# Patient Record
Sex: Male | Born: 1980 | Race: White | Hispanic: No | Marital: Single | Smoking: Current every day smoker
Health system: Southern US, Community
[De-identification: ages and names within clinical notes are randomized; demographics above are authoritative.]

## PROBLEM LIST (undated history)

## (undated) HISTORY — PX: HERNIA REPAIR: SHX51

---

## 2011-01-23 ENCOUNTER — Emergency Department (HOSPITAL_COMMUNITY)
Admission: EM | Admit: 2011-01-23 | Discharge: 2011-01-24 | Attending: Emergency Medicine | Admitting: Emergency Medicine

## 2011-01-23 ENCOUNTER — Encounter: Payer: Self-pay | Admitting: *Deleted

## 2011-01-23 ENCOUNTER — Emergency Department (HOSPITAL_COMMUNITY)

## 2011-01-23 DIAGNOSIS — S0990XA Unspecified injury of head, initial encounter: Secondary | ICD-10-CM | POA: Insufficient documentation

## 2011-01-23 DIAGNOSIS — R42 Dizziness and giddiness: Secondary | ICD-10-CM | POA: Insufficient documentation

## 2011-01-23 DIAGNOSIS — S0181XA Laceration without foreign body of other part of head, initial encounter: Secondary | ICD-10-CM

## 2011-01-23 DIAGNOSIS — S0180XA Unspecified open wound of other part of head, initial encounter: Secondary | ICD-10-CM | POA: Insufficient documentation

## 2011-01-23 DIAGNOSIS — S022XXA Fracture of nasal bones, initial encounter for closed fracture: Secondary | ICD-10-CM | POA: Insufficient documentation

## 2011-01-23 DIAGNOSIS — S02640A Fracture of ramus of mandible, unspecified side, initial encounter for closed fracture: Secondary | ICD-10-CM | POA: Insufficient documentation

## 2011-01-23 DIAGNOSIS — Y9210 Unspecified residential institution as the place of occurrence of the external cause: Secondary | ICD-10-CM | POA: Insufficient documentation

## 2011-01-23 DIAGNOSIS — R112 Nausea with vomiting, unspecified: Secondary | ICD-10-CM | POA: Insufficient documentation

## 2011-01-23 DIAGNOSIS — S02609A Fracture of mandible, unspecified, initial encounter for closed fracture: Secondary | ICD-10-CM

## 2011-01-23 DIAGNOSIS — F17200 Nicotine dependence, unspecified, uncomplicated: Secondary | ICD-10-CM | POA: Insufficient documentation

## 2011-01-23 DIAGNOSIS — R51 Headache: Secondary | ICD-10-CM | POA: Insufficient documentation

## 2011-01-23 MED ORDER — ONDANSETRON 4 MG PO TBDP
ORAL_TABLET | ORAL | Status: AC
  Administered 2011-01-23: 4 mg
  Filled 2011-01-23: qty 1

## 2011-01-23 MED ORDER — LIDOCAINE HCL (PF) 1 % IJ SOLN
INTRAMUSCULAR | Status: AC
  Administered 2011-01-23: 23:00:00
  Filled 2011-01-23: qty 5

## 2011-01-23 MED ORDER — HYDROCODONE-ACETAMINOPHEN 5-325 MG PO TABS
2.0000 | ORAL_TABLET | Freq: Once | ORAL | Status: AC
  Administered 2011-01-23: 2 via ORAL
  Filled 2011-01-23: qty 2

## 2011-01-23 MED ORDER — PROMETHAZINE HCL 25 MG PO TABS: 25.0000 mg | ORAL_TABLET | Freq: Four times a day (QID) | ORAL | Status: AC | PRN

## 2011-01-23 MED ORDER — HYDROCODONE-ACETAMINOPHEN 5-500 MG PO TABS: 1.0000 | ORAL_TABLET | Freq: Four times a day (QID) | ORAL | Status: AC | PRN

## 2011-01-23 NOTE — ED Notes (Signed)
Lac noted below right eye.
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
## 2011-01-23 NOTE — ED Notes (Signed)
Patient transported to CT 

## 2011-01-24 MED ORDER — HYDROMORPHONE HCL PF 1 MG/ML IJ SOLN
INTRAMUSCULAR | Status: AC
  Administered 2011-01-23: 1 mg via INTRAMUSCULAR
  Filled 2011-01-24: qty 1
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
## 2017-10-24 ENCOUNTER — Encounter (HOSPITAL_COMMUNITY): Payer: Self-pay | Admitting: Emergency Medicine

## 2017-10-24 ENCOUNTER — Emergency Department (HOSPITAL_COMMUNITY)
Admission: EM | Admit: 2017-10-24 | Discharge: 2017-10-25 | Disposition: A | Discharge status: Home / Self Care | Payer: Self-pay | Attending: Emergency Medicine | Admitting: Emergency Medicine

## 2017-10-24 ENCOUNTER — Emergency Department (HOSPITAL_COMMUNITY): Payer: Self-pay

## 2017-10-24 ENCOUNTER — Other Ambulatory Visit: Payer: Self-pay

## 2017-10-24 DIAGNOSIS — R109 Unspecified abdominal pain: Secondary | ICD-10-CM | POA: Insufficient documentation

## 2017-10-24 DIAGNOSIS — M7989 Other specified soft tissue disorders: Secondary | ICD-10-CM | POA: Insufficient documentation

## 2017-10-24 DIAGNOSIS — R112 Nausea with vomiting, unspecified: Secondary | ICD-10-CM | POA: Insufficient documentation

## 2017-10-24 DIAGNOSIS — Z5321 Procedure and treatment not carried out due to patient leaving prior to being seen by health care provider: Secondary | ICD-10-CM | POA: Insufficient documentation

## 2017-10-24 DIAGNOSIS — R197 Diarrhea, unspecified: Secondary | ICD-10-CM | POA: Insufficient documentation

## 2017-10-24 LAB — COMPREHENSIVE METABOLIC PANEL
ALK PHOS: 48 U/L (ref 38–126)
ALT: 18 U/L (ref 0–44)
ANION GAP: 5 (ref 5–15)
AST: 22 U/L (ref 15–41)
Albumin: 3.8 g/dL (ref 3.5–5.0)
BILIRUBIN TOTAL: 0.5 mg/dL (ref 0.3–1.2)
BUN: 13 mg/dL (ref 6–20)
CALCIUM: 8.6 mg/dL — AB (ref 8.9–10.3)
CO2: 20 mmol/L — ABNORMAL LOW (ref 22–32)
Chloride: 111 mmol/L (ref 98–111)
Creatinine, Ser: 0.95 mg/dL (ref 0.61–1.24)
Glucose, Bld: 106 mg/dL — ABNORMAL HIGH (ref 70–99)
Potassium: 3.9 mmol/L (ref 3.5–5.1)
SODIUM: 136 mmol/L (ref 135–145)
Total Protein: 6.1 g/dL — ABNORMAL LOW (ref 6.5–8.1)

## 2017-10-24 LAB — URINALYSIS, ROUTINE W REFLEX MICROSCOPIC
Bilirubin Urine: NEGATIVE
Glucose, UA: NEGATIVE mg/dL
HGB URINE DIPSTICK: NEGATIVE
Ketones, ur: NEGATIVE mg/dL
LEUKOCYTES UA: NEGATIVE
Nitrite: NEGATIVE
PROTEIN: NEGATIVE mg/dL
Specific Gravity, Urine: 1.029 (ref 1.005–1.030)
pH: 5 (ref 5.0–8.0)

## 2017-10-24 LAB — CBC WITH DIFFERENTIAL/PLATELET
ABS IMMATURE GRANULOCYTES: 0 10*3/uL (ref 0.0–0.1)
BASOS ABS: 0.1 10*3/uL (ref 0.0–0.1)
BASOS PCT: 1 %
Eosinophils Absolute: 0.4 10*3/uL (ref 0.0–0.7)
Eosinophils Relative: 6 %
HCT: 45 % (ref 39.0–52.0)
Hemoglobin: 14.9 g/dL (ref 13.0–17.0)
Immature Granulocytes: 0 %
Lymphocytes Relative: 31 %
Lymphs Abs: 1.9 10*3/uL (ref 0.7–4.0)
MCH: 31.4 pg (ref 26.0–34.0)
MCHC: 33.1 g/dL (ref 30.0–36.0)
MCV: 94.7 fL (ref 78.0–100.0)
Monocytes Absolute: 0.6 10*3/uL (ref 0.1–1.0)
Monocytes Relative: 11 %
NEUTROS ABS: 3.1 10*3/uL (ref 1.7–7.7)
NEUTROS PCT: 51 %
PLATELETS: 207 10*3/uL (ref 150–400)
RBC: 4.75 MIL/uL (ref 4.22–5.81)
RDW: 12.6 % (ref 11.5–15.5)
WBC: 6.1 10*3/uL (ref 4.0–10.5)

## 2017-10-24 LAB — LIPASE, BLOOD: LIPASE: 26 U/L (ref 11–51)

## 2017-10-24 LAB — I-STAT CG4 LACTIC ACID, ED: Lactic Acid, Venous: 1.06 mmol/L (ref 0.5–1.9)

## 2017-10-24 NOTE — ED Notes (Signed)
No answer x3 for treatment room 

## 2017-10-24 NOTE — ED Triage Notes (Addendum)
Pt states he has had mid abdominal pain since yesterday with nausea vomiting and diarrhea. Describes the pain as a cramping. He also has several spots on his arms of "scabs" that are new too. Pt also states his ankles and wrists are swollen too. Pt states his diarrhea is yellow. Denies any recent travel. Also has body aches, cough and congestion.

## 2019-11-24 IMAGING — CR DG CHEST 2V
2 series · 2 of 2 positions shown · non-contrast
Comparison: None.

CLINICAL DATA: Dyspnea

EXAM:
CHEST - 2 VIEW

[chest pa]
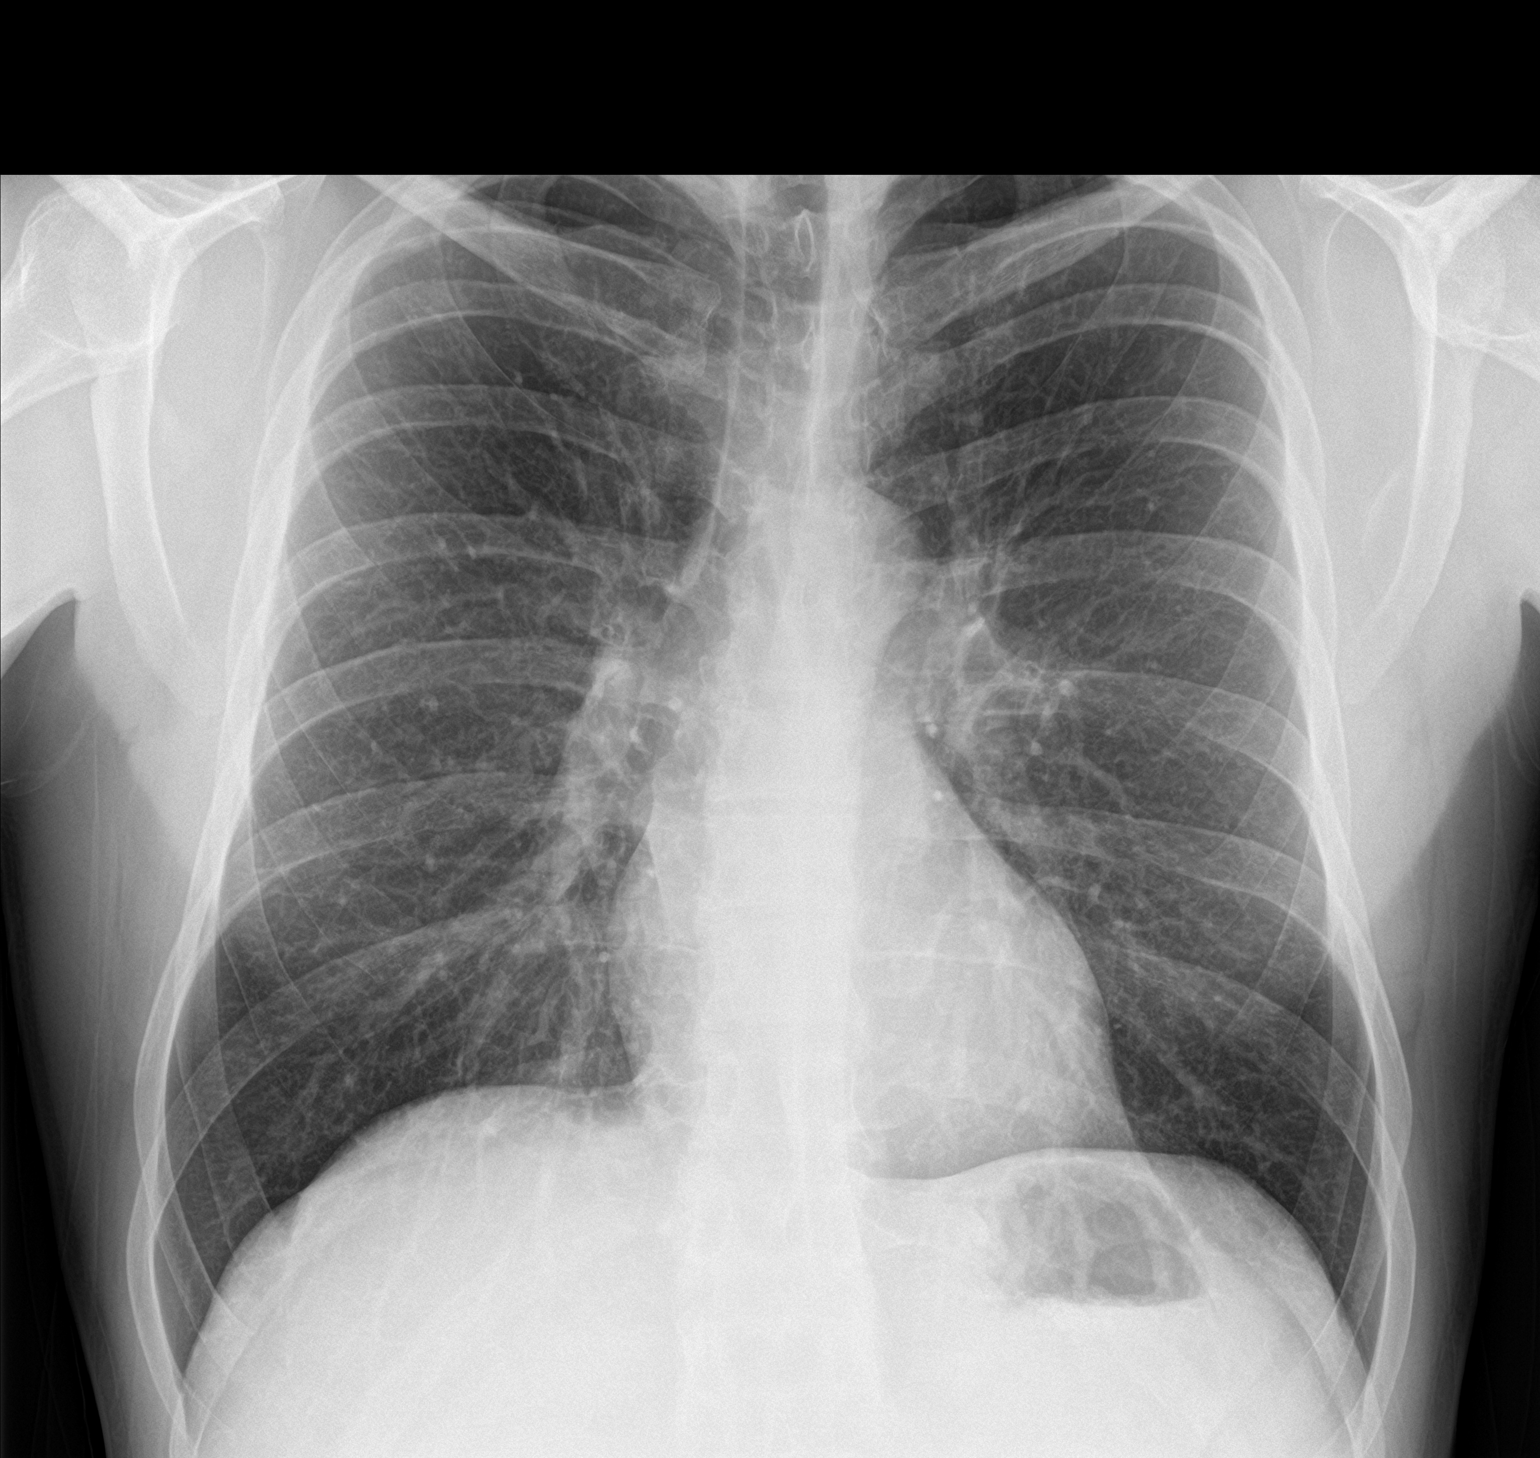

[chest lat]
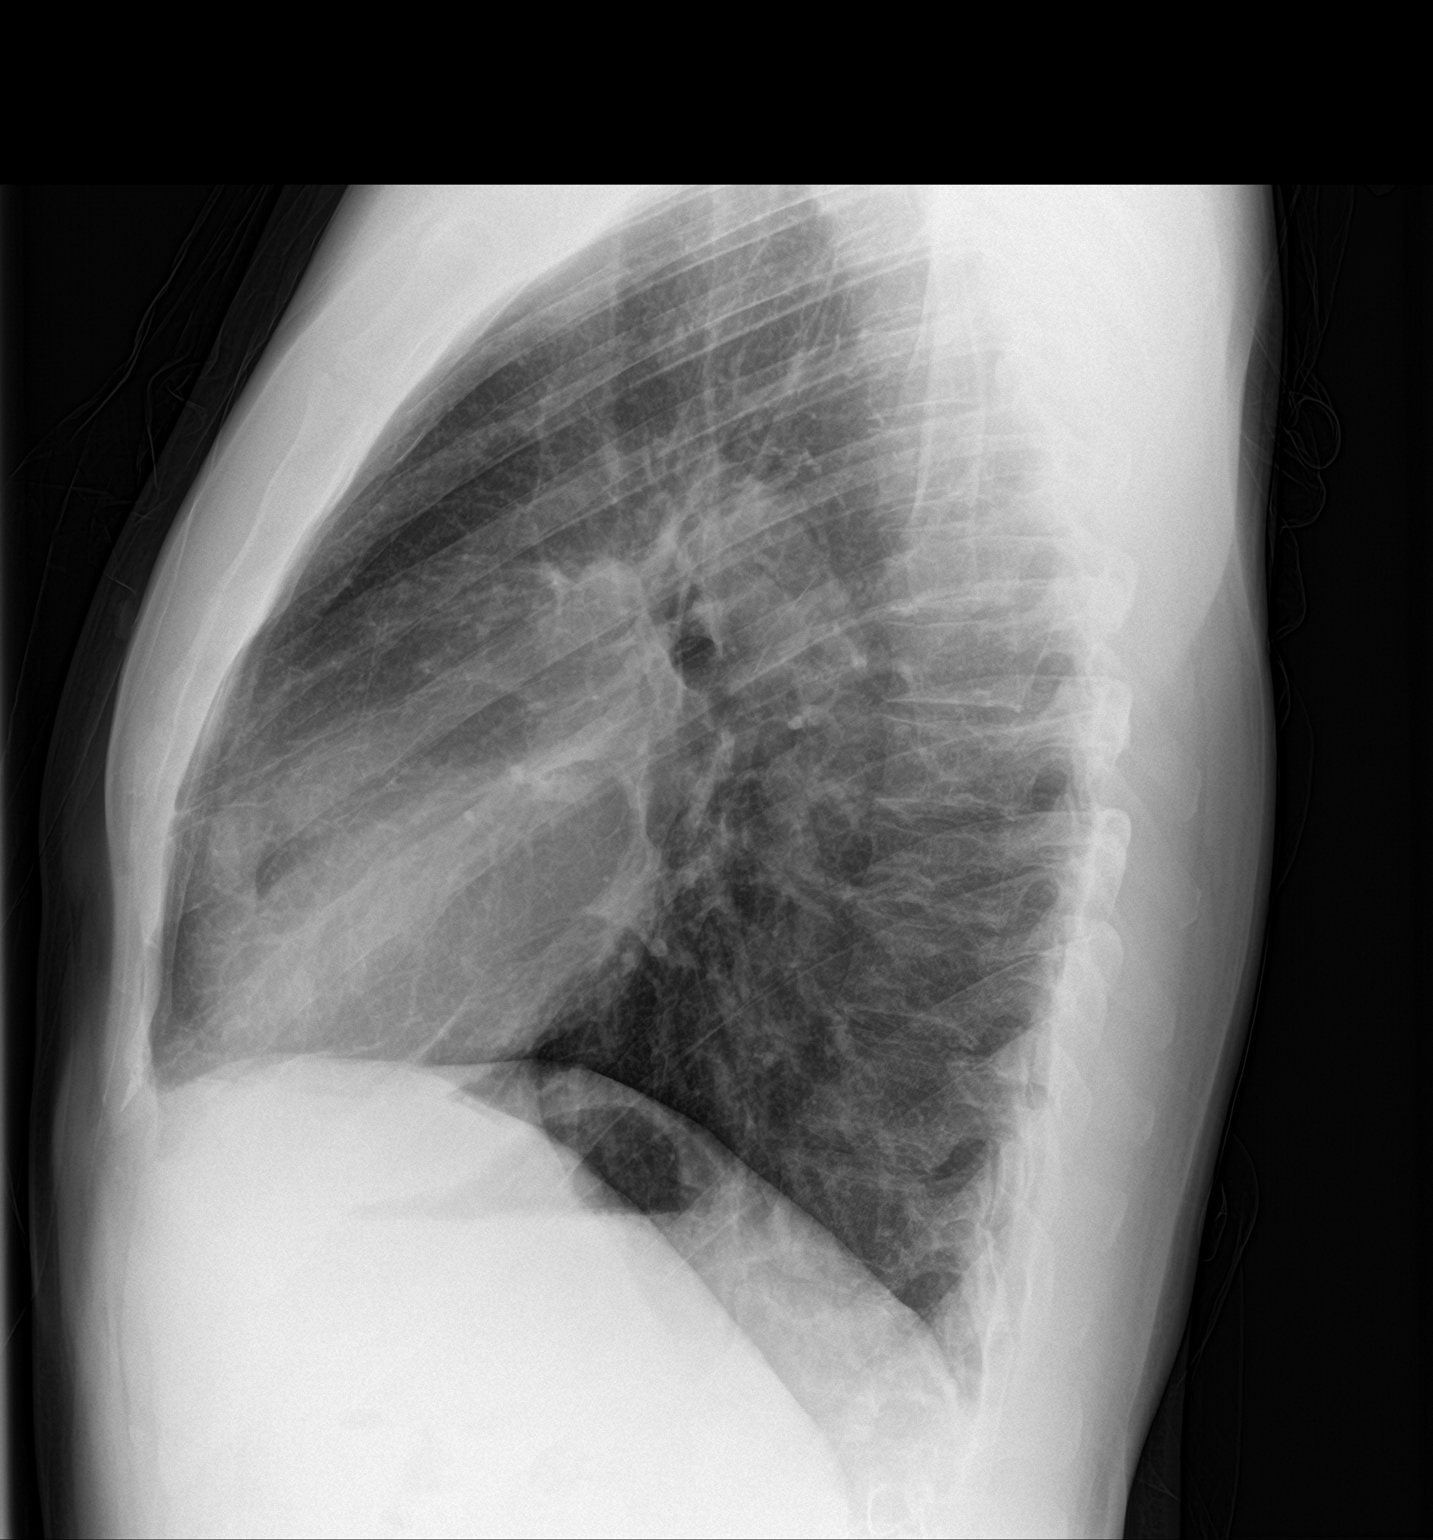

[2 of 2 positions shown; findings below may reference images not displayed]

FINDINGS: Normal heart size. Normal mediastinal contour. No pneumothorax. No
pleural effusion. Lungs appear clear, with no acute consolidative
airspace disease and no pulmonary edema.
IMPRESSION: No active cardiopulmonary disease.
